# Patient Record
Sex: Male | Born: 1945 | Race: White | Hispanic: No | Marital: Married
Health system: Southern US, Community
[De-identification: ages and names within clinical notes are randomized; demographics above are authoritative.]

---

## 2000-07-25 ENCOUNTER — Encounter: Admission: RE | Admit: 2000-07-25 | Discharge: 2000-10-23 | Payer: Self-pay | Admitting: Internal Medicine

## 2000-11-23 ENCOUNTER — Ambulatory Visit (HOSPITAL_COMMUNITY): Admission: RE | Admit: 2000-11-23 | Discharge: 2000-11-23 | Payer: Self-pay | Admitting: Internal Medicine

## 2000-11-23 ENCOUNTER — Encounter: Payer: Self-pay | Admitting: Internal Medicine

## 2000-11-24 ENCOUNTER — Encounter: Payer: Self-pay | Admitting: Internal Medicine

## 2000-11-24 ENCOUNTER — Ambulatory Visit (HOSPITAL_COMMUNITY): Admission: RE | Admit: 2000-11-24 | Discharge: 2000-11-24 | Payer: Self-pay | Admitting: Internal Medicine

## 2001-05-15 ENCOUNTER — Encounter: Admission: RE | Admit: 2001-05-15 | Discharge: 2001-08-13 | Payer: Self-pay | Admitting: Internal Medicine

## 2002-04-18 ENCOUNTER — Other Ambulatory Visit: Admission: RE | Admit: 2002-04-18 | Discharge: 2002-04-18 | Payer: Self-pay | Admitting: Dermatology

## 2002-06-29 ENCOUNTER — Ambulatory Visit (HOSPITAL_COMMUNITY): Admission: RE | Admit: 2002-06-29 | Discharge: 2002-06-29 | Payer: Self-pay | Admitting: Internal Medicine

## 2002-07-05 ENCOUNTER — Encounter: Admission: RE | Admit: 2002-07-05 | Discharge: 2002-10-03 | Payer: Self-pay | Admitting: Internal Medicine

## 2003-02-04 ENCOUNTER — Encounter (HOSPITAL_COMMUNITY): Admission: RE | Admit: 2003-02-04 | Discharge: 2003-03-06 | Payer: Self-pay | Admitting: Internal Medicine

## 2003-02-05 ENCOUNTER — Encounter: Payer: Self-pay | Admitting: Internal Medicine

## 2004-03-12 ENCOUNTER — Encounter: Payer: Self-pay | Admitting: Emergency Medicine

## 2004-03-12 ENCOUNTER — Inpatient Hospital Stay (HOSPITAL_COMMUNITY): Admission: EM | Admit: 2004-03-12 | Discharge: 2004-03-15 | Payer: Self-pay | Admitting: Emergency Medicine

## 2004-03-20 ENCOUNTER — Encounter (HOSPITAL_COMMUNITY): Admission: RE | Admit: 2004-03-20 | Discharge: 2004-04-19 | Payer: Self-pay | Admitting: Neurology

## 2004-05-23 ENCOUNTER — Emergency Department (HOSPITAL_COMMUNITY): Admission: EM | Admit: 2004-05-23 | Discharge: 2004-05-23 | Payer: Self-pay | Admitting: Emergency Medicine

## 2004-05-25 ENCOUNTER — Ambulatory Visit: Payer: Self-pay | Admitting: Orthopedic Surgery

## 2004-06-15 ENCOUNTER — Ambulatory Visit: Payer: Self-pay | Admitting: Orthopedic Surgery

## 2005-10-04 ENCOUNTER — Ambulatory Visit: Payer: Self-pay | Admitting: Orthopedic Surgery

## 2011-04-07 DIAGNOSIS — L509 Urticaria, unspecified: Secondary | ICD-10-CM | POA: Insufficient documentation

## 2011-04-07 DIAGNOSIS — I872 Venous insufficiency (chronic) (peripheral): Secondary | ICD-10-CM | POA: Insufficient documentation

## 2011-04-07 DIAGNOSIS — L821 Other seborrheic keratosis: Secondary | ICD-10-CM | POA: Insufficient documentation

## 2011-10-28 ENCOUNTER — Other Ambulatory Visit (HOSPITAL_COMMUNITY): Payer: Self-pay | Admitting: Internal Medicine

## 2011-10-28 ENCOUNTER — Ambulatory Visit (HOSPITAL_COMMUNITY)
Admission: RE | Admit: 2011-10-28 | Discharge: 2011-10-28 | Disposition: A | Discharge status: Home / Self Care | Payer: BC Managed Care – PPO | Source: Ambulatory Visit | Attending: Internal Medicine | Admitting: Internal Medicine

## 2011-10-28 DIAGNOSIS — M25519 Pain in unspecified shoulder: Secondary | ICD-10-CM

## 2011-10-29 ENCOUNTER — Other Ambulatory Visit (HOSPITAL_COMMUNITY): Payer: Self-pay | Admitting: Internal Medicine

## 2011-10-29 DIAGNOSIS — M5412 Radiculopathy, cervical region: Secondary | ICD-10-CM

## 2011-11-01 ENCOUNTER — Ambulatory Visit (HOSPITAL_COMMUNITY)
Admission: RE | Admit: 2011-11-01 | Discharge: 2011-11-01 | Disposition: A | Discharge status: Home / Self Care | Payer: BC Managed Care – PPO | Source: Ambulatory Visit | Attending: Internal Medicine | Admitting: Internal Medicine

## 2011-11-01 DIAGNOSIS — M5020 Other cervical disc displacement, unspecified cervical region: Secondary | ICD-10-CM | POA: Insufficient documentation

## 2011-11-01 DIAGNOSIS — M5030 Other cervical disc degeneration, unspecified cervical region: Secondary | ICD-10-CM | POA: Insufficient documentation

## 2011-11-01 DIAGNOSIS — M542 Cervicalgia: Secondary | ICD-10-CM | POA: Insufficient documentation

## 2011-11-01 DIAGNOSIS — R209 Unspecified disturbances of skin sensation: Secondary | ICD-10-CM | POA: Insufficient documentation

## 2011-11-01 DIAGNOSIS — M5412 Radiculopathy, cervical region: Secondary | ICD-10-CM

## 2012-09-25 ENCOUNTER — Ambulatory Visit (HOSPITAL_COMMUNITY)
Admission: RE | Admit: 2012-09-25 | Discharge: 2012-09-25 | Disposition: A | Discharge status: Home / Self Care | Payer: Medicare Other | Source: Ambulatory Visit | Attending: Internal Medicine | Admitting: Internal Medicine

## 2012-09-25 ENCOUNTER — Other Ambulatory Visit (HOSPITAL_COMMUNITY): Payer: Self-pay | Admitting: Internal Medicine

## 2012-09-25 DIAGNOSIS — W19XXXA Unspecified fall, initial encounter: Secondary | ICD-10-CM

## 2012-09-25 DIAGNOSIS — M25569 Pain in unspecified knee: Secondary | ICD-10-CM | POA: Insufficient documentation

## 2013-01-22 ENCOUNTER — Ambulatory Visit (HOSPITAL_COMMUNITY)
Admission: RE | Admit: 2013-01-22 | Discharge: 2013-01-22 | Disposition: A | Discharge status: Home / Self Care | Payer: Medicare Other | Source: Ambulatory Visit | Attending: Internal Medicine | Admitting: Internal Medicine

## 2013-01-22 ENCOUNTER — Other Ambulatory Visit (HOSPITAL_COMMUNITY): Payer: Self-pay | Admitting: Internal Medicine

## 2013-01-22 DIAGNOSIS — K7689 Other specified diseases of liver: Secondary | ICD-10-CM | POA: Insufficient documentation

## 2013-01-22 DIAGNOSIS — R1011 Right upper quadrant pain: Secondary | ICD-10-CM

## 2013-04-11 DIAGNOSIS — D485 Neoplasm of uncertain behavior of skin: Secondary | ICD-10-CM | POA: Insufficient documentation

## 2015-04-16 DIAGNOSIS — Z85828 Personal history of other malignant neoplasm of skin: Secondary | ICD-10-CM | POA: Insufficient documentation

## 2015-06-30 DIAGNOSIS — E119 Type 2 diabetes mellitus without complications: Secondary | ICD-10-CM | POA: Diagnosis not present

## 2015-07-08 DIAGNOSIS — Z6831 Body mass index (BMI) 31.0-31.9, adult: Secondary | ICD-10-CM | POA: Diagnosis not present

## 2015-07-08 DIAGNOSIS — E119 Type 2 diabetes mellitus without complications: Secondary | ICD-10-CM | POA: Diagnosis not present

## 2015-07-08 DIAGNOSIS — I10 Essential (primary) hypertension: Secondary | ICD-10-CM | POA: Diagnosis not present

## 2015-07-16 DIAGNOSIS — M5412 Radiculopathy, cervical region: Secondary | ICD-10-CM | POA: Diagnosis not present

## 2015-07-23 ENCOUNTER — Ambulatory Visit: Payer: Self-pay | Admitting: Nutrition

## 2015-07-29 ENCOUNTER — Other Ambulatory Visit (HOSPITAL_COMMUNITY): Payer: Self-pay | Admitting: Internal Medicine

## 2015-07-29 DIAGNOSIS — M47812 Spondylosis without myelopathy or radiculopathy, cervical region: Secondary | ICD-10-CM

## 2015-08-01 ENCOUNTER — Ambulatory Visit (HOSPITAL_COMMUNITY)
Admission: RE | Admit: 2015-08-01 | Discharge: 2015-08-01 | Disposition: A | Discharge status: Home / Self Care | Payer: Medicare HMO | Source: Ambulatory Visit | Attending: Internal Medicine | Admitting: Internal Medicine

## 2015-08-01 DIAGNOSIS — M47812 Spondylosis without myelopathy or radiculopathy, cervical region: Secondary | ICD-10-CM | POA: Insufficient documentation

## 2015-08-01 DIAGNOSIS — M25512 Pain in left shoulder: Secondary | ICD-10-CM | POA: Diagnosis present

## 2015-08-01 DIAGNOSIS — M4692 Unspecified inflammatory spondylopathy, cervical region: Secondary | ICD-10-CM | POA: Diagnosis not present

## 2015-08-01 DIAGNOSIS — M5030 Other cervical disc degeneration, unspecified cervical region: Secondary | ICD-10-CM | POA: Diagnosis not present

## 2015-08-01 DIAGNOSIS — M542 Cervicalgia: Secondary | ICD-10-CM | POA: Diagnosis present

## 2015-08-01 DIAGNOSIS — M50222 Other cervical disc displacement at C5-C6 level: Secondary | ICD-10-CM | POA: Diagnosis not present

## 2015-08-01 DIAGNOSIS — M4802 Spinal stenosis, cervical region: Secondary | ICD-10-CM | POA: Insufficient documentation

## 2015-10-21 DIAGNOSIS — Z1211 Encounter for screening for malignant neoplasm of colon: Secondary | ICD-10-CM | POA: Diagnosis not present

## 2015-10-21 DIAGNOSIS — E669 Obesity, unspecified: Secondary | ICD-10-CM | POA: Diagnosis not present

## 2015-10-21 DIAGNOSIS — Z86010 Personal history of colonic polyps: Secondary | ICD-10-CM | POA: Diagnosis not present

## 2015-10-21 DIAGNOSIS — K5730 Diverticulosis of large intestine without perforation or abscess without bleeding: Secondary | ICD-10-CM | POA: Diagnosis not present

## 2015-10-31 DIAGNOSIS — I6350 Cerebral infarction due to unspecified occlusion or stenosis of unspecified cerebral artery: Secondary | ICD-10-CM | POA: Diagnosis not present

## 2015-10-31 DIAGNOSIS — E785 Hyperlipidemia, unspecified: Secondary | ICD-10-CM | POA: Diagnosis not present

## 2015-10-31 DIAGNOSIS — Z79899 Other long term (current) drug therapy: Secondary | ICD-10-CM | POA: Diagnosis not present

## 2015-10-31 DIAGNOSIS — E119 Type 2 diabetes mellitus without complications: Secondary | ICD-10-CM | POA: Diagnosis not present

## 2015-10-31 DIAGNOSIS — K219 Gastro-esophageal reflux disease without esophagitis: Secondary | ICD-10-CM | POA: Diagnosis not present

## 2015-10-31 DIAGNOSIS — Z125 Encounter for screening for malignant neoplasm of prostate: Secondary | ICD-10-CM | POA: Diagnosis not present

## 2015-10-31 DIAGNOSIS — I10 Essential (primary) hypertension: Secondary | ICD-10-CM | POA: Diagnosis not present

## 2015-11-07 DIAGNOSIS — E119 Type 2 diabetes mellitus without complications: Secondary | ICD-10-CM | POA: Diagnosis not present

## 2015-11-07 DIAGNOSIS — Z6832 Body mass index (BMI) 32.0-32.9, adult: Secondary | ICD-10-CM | POA: Diagnosis not present

## 2015-11-07 DIAGNOSIS — E785 Hyperlipidemia, unspecified: Secondary | ICD-10-CM | POA: Diagnosis not present

## 2015-11-07 DIAGNOSIS — K760 Fatty (change of) liver, not elsewhere classified: Secondary | ICD-10-CM | POA: Diagnosis not present

## 2015-12-05 DIAGNOSIS — Z1212 Encounter for screening for malignant neoplasm of rectum: Secondary | ICD-10-CM | POA: Diagnosis not present

## 2015-12-05 DIAGNOSIS — Z1211 Encounter for screening for malignant neoplasm of colon: Secondary | ICD-10-CM | POA: Diagnosis not present

## 2016-02-24 DIAGNOSIS — E119 Type 2 diabetes mellitus without complications: Secondary | ICD-10-CM | POA: Diagnosis not present

## 2016-03-05 DIAGNOSIS — Z79899 Other long term (current) drug therapy: Secondary | ICD-10-CM | POA: Diagnosis not present

## 2016-03-05 DIAGNOSIS — E119 Type 2 diabetes mellitus without complications: Secondary | ICD-10-CM | POA: Diagnosis not present

## 2016-03-12 DIAGNOSIS — E1149 Type 2 diabetes mellitus with other diabetic neurological complication: Secondary | ICD-10-CM | POA: Diagnosis not present

## 2016-03-12 DIAGNOSIS — Z23 Encounter for immunization: Secondary | ICD-10-CM | POA: Diagnosis not present

## 2016-03-12 DIAGNOSIS — K760 Fatty (change of) liver, not elsewhere classified: Secondary | ICD-10-CM | POA: Diagnosis not present

## 2016-03-12 DIAGNOSIS — I10 Essential (primary) hypertension: Secondary | ICD-10-CM | POA: Diagnosis not present

## 2016-04-19 DIAGNOSIS — Z85828 Personal history of other malignant neoplasm of skin: Secondary | ICD-10-CM | POA: Diagnosis not present

## 2016-04-19 DIAGNOSIS — L509 Urticaria, unspecified: Secondary | ICD-10-CM | POA: Diagnosis not present

## 2016-04-19 DIAGNOSIS — L820 Inflamed seborrheic keratosis: Secondary | ICD-10-CM | POA: Diagnosis not present

## 2016-04-19 DIAGNOSIS — L821 Other seborrheic keratosis: Secondary | ICD-10-CM | POA: Diagnosis not present

## 2016-07-09 DIAGNOSIS — Z79899 Other long term (current) drug therapy: Secondary | ICD-10-CM | POA: Diagnosis not present

## 2016-07-09 DIAGNOSIS — E119 Type 2 diabetes mellitus without complications: Secondary | ICD-10-CM | POA: Diagnosis not present

## 2016-07-09 DIAGNOSIS — K760 Fatty (change of) liver, not elsewhere classified: Secondary | ICD-10-CM | POA: Diagnosis not present

## 2016-07-16 DIAGNOSIS — K760 Fatty (change of) liver, not elsewhere classified: Secondary | ICD-10-CM | POA: Diagnosis not present

## 2016-07-16 DIAGNOSIS — E1149 Type 2 diabetes mellitus with other diabetic neurological complication: Secondary | ICD-10-CM | POA: Diagnosis not present

## 2016-07-16 DIAGNOSIS — Z6831 Body mass index (BMI) 31.0-31.9, adult: Secondary | ICD-10-CM | POA: Diagnosis not present

## 2016-07-16 DIAGNOSIS — K219 Gastro-esophageal reflux disease without esophagitis: Secondary | ICD-10-CM | POA: Diagnosis not present

## 2016-09-25 IMAGING — MR MR CERVICAL SPINE W/O CM
4 of 5 series · 14 of 48 positions shown · non-contrast
Comparison: 11/01/2011

CLINICAL DATA: Cervical spondylosis. Neck pain extending into the
left shoulder for 3-4 weeks without a known injury.

EXAM:
MRI CERVICAL SPINE WITHOUT CONTRAST
TECHNIQUE: Multiplanar, multisequence MR imaging of the cervical spine was
performed. No intravenous contrast was administered.

[Series 3: T2 · sagittal · 3.0mm · 0.40mm/px · 5 of 13 slices shown (1 of 2)]
[im 1/13]
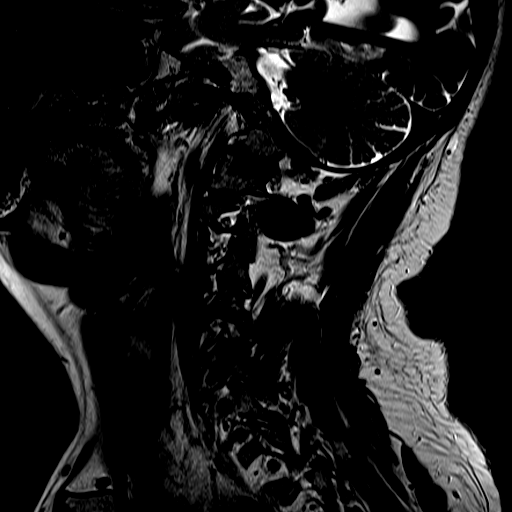
[im 4/13]
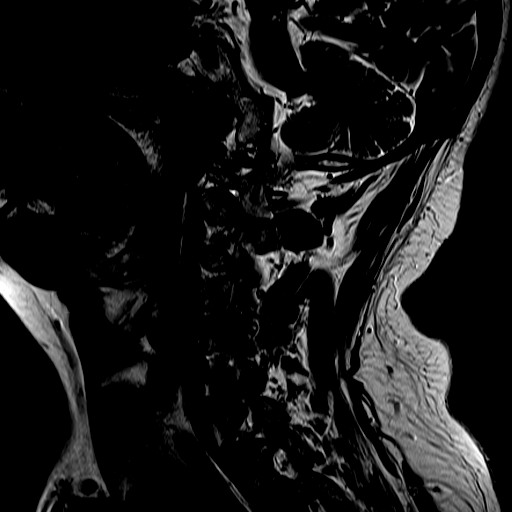
[im 7/13]
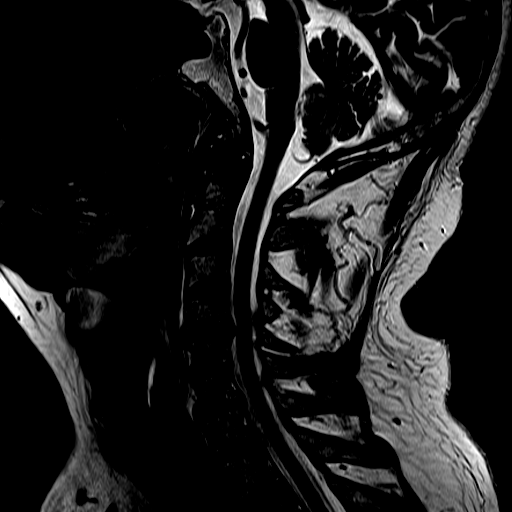
[im 10/13]
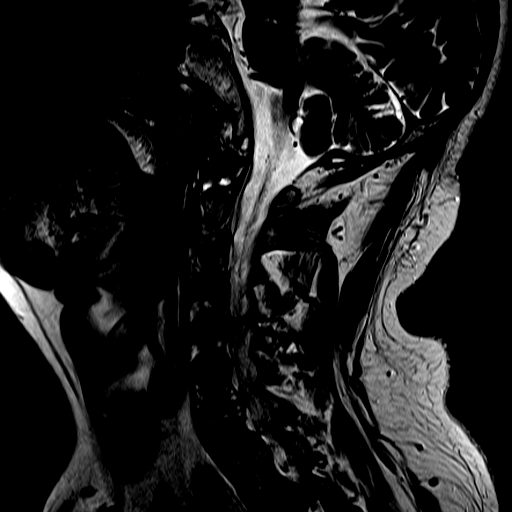
[im 13/13]
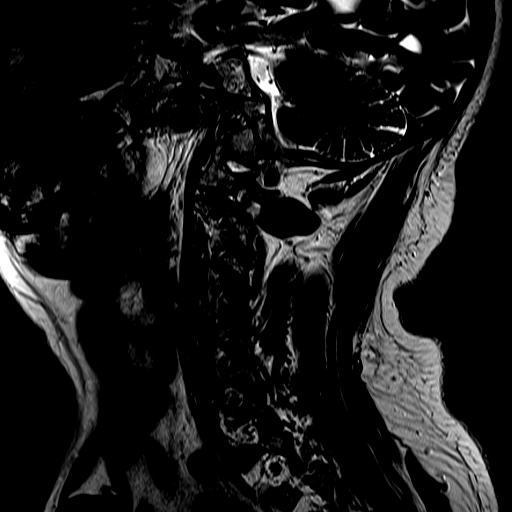

[Series 4: FLAIR · sagittal · 3.0mm · 0.44mm/px · 3 of 13 slices shown]
[im 1/13]
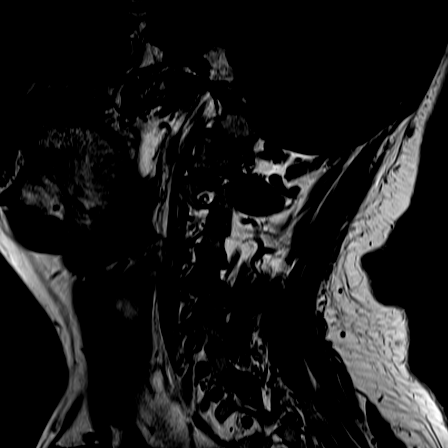
[im 7/13]
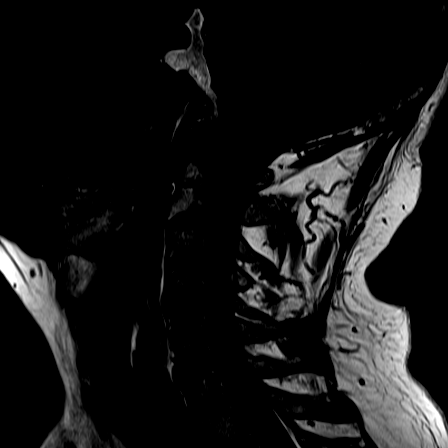
[im 13/13]
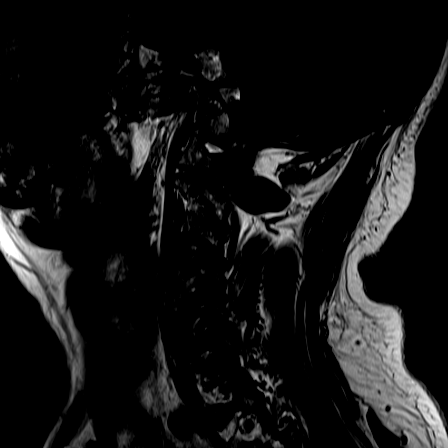

[Series 5: ir sagital · sagittal · 3.0mm · 0.24mm/px · 3 of 13 slices shown]
[im 3/13]
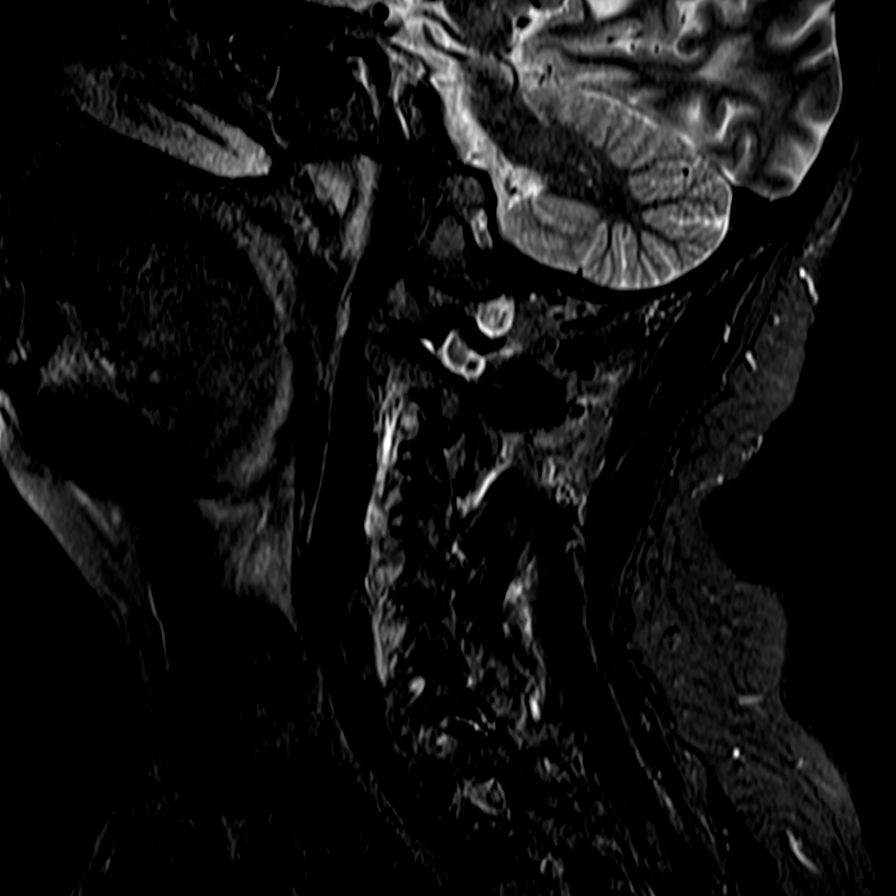
[im 8/13]
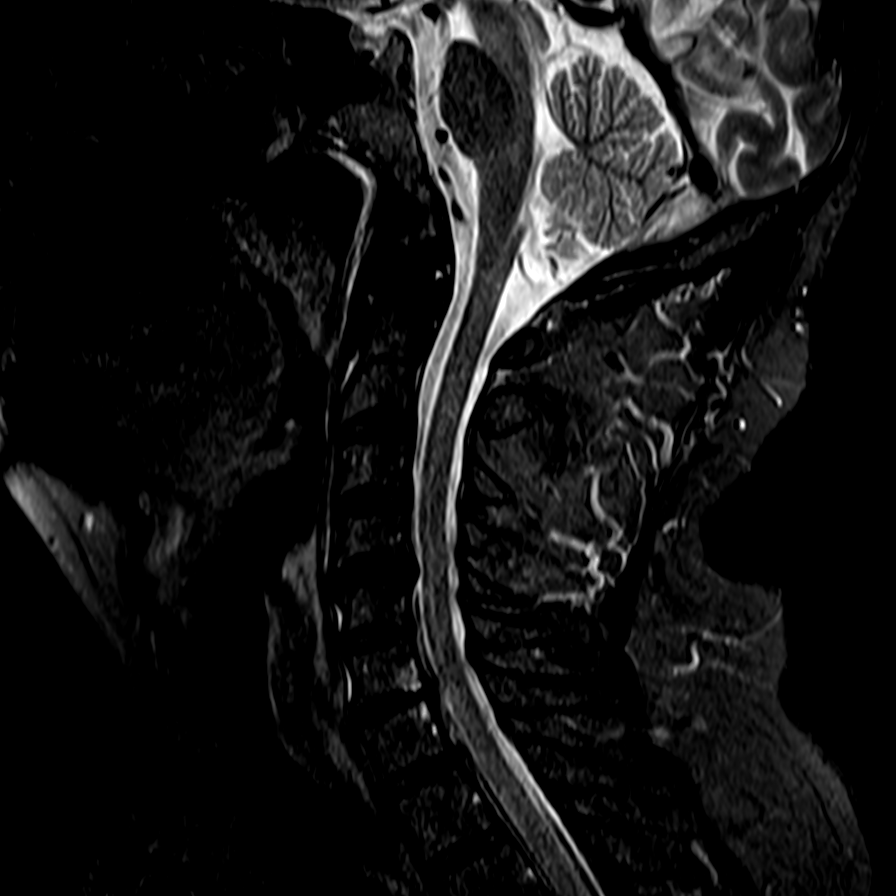
[im 13/13]
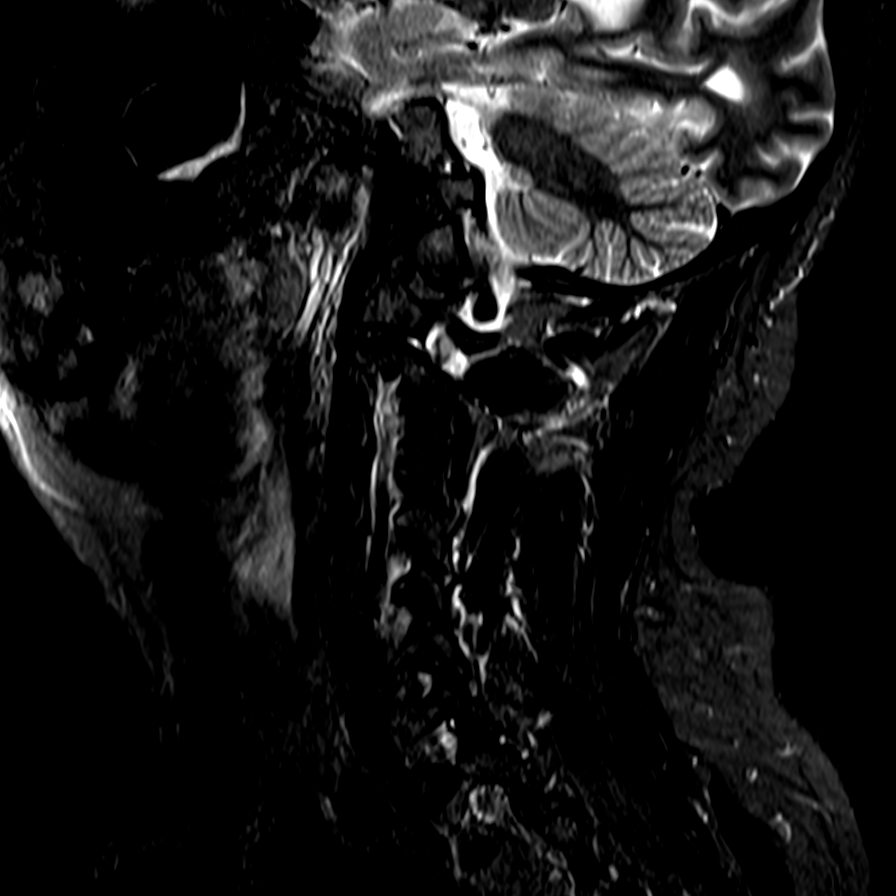

[Series 7: T2 · axial · 3.0mm · 0.22mm/px · z∈[-72,+13]mm · 3 of 36 slices shown (2 of 2)]
[im 5/36]
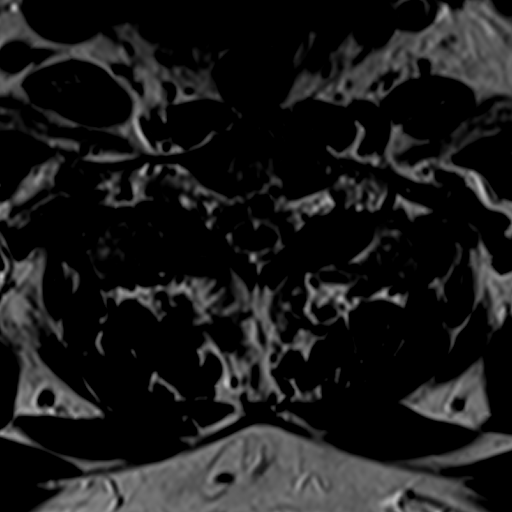
[im 19/36]
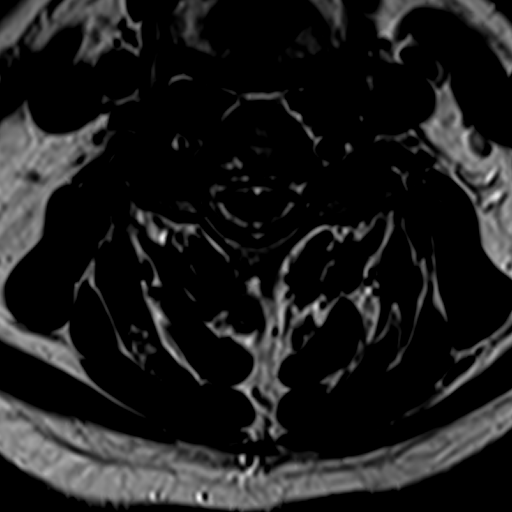
[im 31/36]
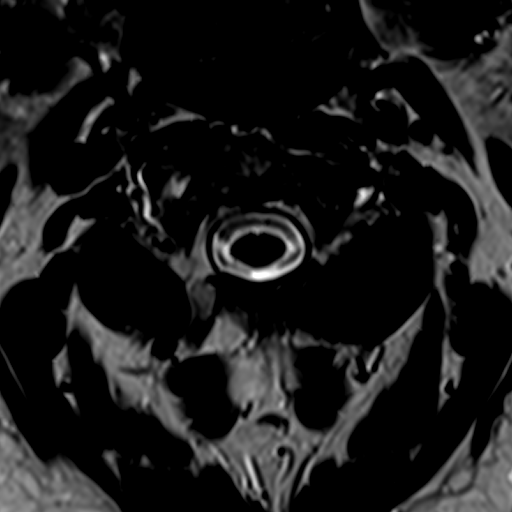

[14 of 48 positions shown; findings below may reference images not displayed]

FINDINGS: Vertebral alignment is normal. Vertebral body heights are preserved.
New, mild degenerative endplate edema is noted posteriorly at C6-7.
There is also new, mild edema associated with right-sided facet
arthritis at C4-5. Mild C1-2 degenerative change is noted. The
cervical spinal cord is normal in caliber and signal. Paraspinal
soft tissues are unremarkable.

C2-3: Mild to moderate left facet arthrosis and uncovertebral
spurring without significant stenosis.

C3-4: Mild left facet arthrosis without disc herniation or stenosis,
unchanged.

C4-5: Disc bulging, uncovertebral spurring, and right greater than
left facet arthrosis result in mild spinal stenosis and mild
bilateral neural foraminal stenosis, unchanged. There is at most a
minimal impression on the ventral spinal cord.

C5-6: Minimal uncovertebral and facet arthrosis without stenosis,
unchanged.

C6-7: Disc bulging and uncovertebral spurring result in moderate
bilateral neural foraminal stenosis without significant spinal
stenosis, unchanged.

C7-T1: Small left central disc extrusion without stenosis,
unchanged.
IMPRESSION: 1. New edema associated with right-sided facet arthritis at C4-5.
2. Multilevel disc degeneration without significant interval change.
Mild spinal stenosis at C4-5 and moderate bilateral foraminal
stenosis at C6-7.

## 2016-11-09 DIAGNOSIS — G464 Cerebellar stroke syndrome: Secondary | ICD-10-CM | POA: Diagnosis not present

## 2016-11-09 DIAGNOSIS — D3709 Neoplasm of uncertain behavior of other specified sites of the oral cavity: Secondary | ICD-10-CM | POA: Diagnosis not present

## 2016-11-09 DIAGNOSIS — E119 Type 2 diabetes mellitus without complications: Secondary | ICD-10-CM | POA: Diagnosis not present

## 2016-11-09 DIAGNOSIS — K219 Gastro-esophageal reflux disease without esophagitis: Secondary | ICD-10-CM | POA: Diagnosis not present

## 2016-11-09 DIAGNOSIS — Z125 Encounter for screening for malignant neoplasm of prostate: Secondary | ICD-10-CM | POA: Diagnosis not present

## 2016-11-09 DIAGNOSIS — I10 Essential (primary) hypertension: Secondary | ICD-10-CM | POA: Diagnosis not present

## 2016-11-09 DIAGNOSIS — Z79899 Other long term (current) drug therapy: Secondary | ICD-10-CM | POA: Diagnosis not present

## 2016-11-16 DIAGNOSIS — E785 Hyperlipidemia, unspecified: Secondary | ICD-10-CM | POA: Diagnosis not present

## 2016-11-16 DIAGNOSIS — K760 Fatty (change of) liver, not elsewhere classified: Secondary | ICD-10-CM | POA: Diagnosis not present

## 2016-11-16 DIAGNOSIS — E1149 Type 2 diabetes mellitus with other diabetic neurological complication: Secondary | ICD-10-CM | POA: Diagnosis not present

## 2016-11-17 DIAGNOSIS — D1030 Benign neoplasm of unspecified part of mouth: Secondary | ICD-10-CM | POA: Diagnosis not present

## 2016-11-18 DIAGNOSIS — D1809 Hemangioma of other sites: Secondary | ICD-10-CM | POA: Diagnosis not present

## 2016-11-23 DIAGNOSIS — Z86010 Personal history of colonic polyps: Secondary | ICD-10-CM | POA: Diagnosis not present

## 2016-11-23 DIAGNOSIS — K5730 Diverticulosis of large intestine without perforation or abscess without bleeding: Secondary | ICD-10-CM | POA: Diagnosis not present

## 2016-11-23 DIAGNOSIS — Z1211 Encounter for screening for malignant neoplasm of colon: Secondary | ICD-10-CM | POA: Diagnosis not present

## 2017-01-03 DIAGNOSIS — D123 Benign neoplasm of transverse colon: Secondary | ICD-10-CM | POA: Diagnosis not present

## 2017-01-03 DIAGNOSIS — D125 Benign neoplasm of sigmoid colon: Secondary | ICD-10-CM | POA: Diagnosis not present

## 2017-01-03 DIAGNOSIS — D122 Benign neoplasm of ascending colon: Secondary | ICD-10-CM | POA: Diagnosis not present

## 2017-01-03 DIAGNOSIS — Z1211 Encounter for screening for malignant neoplasm of colon: Secondary | ICD-10-CM | POA: Diagnosis not present

## 2017-01-03 DIAGNOSIS — K635 Polyp of colon: Secondary | ICD-10-CM | POA: Diagnosis not present

## 2017-01-03 DIAGNOSIS — D120 Benign neoplasm of cecum: Secondary | ICD-10-CM | POA: Diagnosis not present

## 2017-02-10 DIAGNOSIS — E119 Type 2 diabetes mellitus without complications: Secondary | ICD-10-CM | POA: Diagnosis not present

## 2017-02-10 DIAGNOSIS — H35362 Drusen (degenerative) of macula, left eye: Secondary | ICD-10-CM | POA: Diagnosis not present

## 2017-03-03 DIAGNOSIS — R69 Illness, unspecified: Secondary | ICD-10-CM | POA: Diagnosis not present

## 2017-03-21 DIAGNOSIS — R69 Illness, unspecified: Secondary | ICD-10-CM | POA: Diagnosis not present

## 2017-03-21 DIAGNOSIS — E119 Type 2 diabetes mellitus without complications: Secondary | ICD-10-CM | POA: Diagnosis not present

## 2017-03-28 DIAGNOSIS — E1149 Type 2 diabetes mellitus with other diabetic neurological complication: Secondary | ICD-10-CM | POA: Diagnosis not present

## 2017-03-28 DIAGNOSIS — I10 Essential (primary) hypertension: Secondary | ICD-10-CM | POA: Diagnosis not present

## 2017-04-20 DIAGNOSIS — Z85828 Personal history of other malignant neoplasm of skin: Secondary | ICD-10-CM | POA: Diagnosis not present

## 2017-04-20 DIAGNOSIS — L821 Other seborrheic keratosis: Secondary | ICD-10-CM | POA: Diagnosis not present

## 2017-04-20 DIAGNOSIS — L509 Urticaria, unspecified: Secondary | ICD-10-CM | POA: Diagnosis not present

## 2017-04-20 DIAGNOSIS — L570 Actinic keratosis: Secondary | ICD-10-CM | POA: Diagnosis not present

## 2017-04-20 DIAGNOSIS — I872 Venous insufficiency (chronic) (peripheral): Secondary | ICD-10-CM | POA: Diagnosis not present

## 2017-07-25 DIAGNOSIS — E1142 Type 2 diabetes mellitus with diabetic polyneuropathy: Secondary | ICD-10-CM | POA: Diagnosis not present

## 2017-07-25 DIAGNOSIS — I10 Essential (primary) hypertension: Secondary | ICD-10-CM | POA: Diagnosis not present

## 2017-08-01 DIAGNOSIS — I10 Essential (primary) hypertension: Secondary | ICD-10-CM | POA: Diagnosis not present

## 2017-08-01 DIAGNOSIS — Z6832 Body mass index (BMI) 32.0-32.9, adult: Secondary | ICD-10-CM | POA: Diagnosis not present

## 2017-08-01 DIAGNOSIS — E1149 Type 2 diabetes mellitus with other diabetic neurological complication: Secondary | ICD-10-CM | POA: Diagnosis not present

## 2017-11-14 DIAGNOSIS — R69 Illness, unspecified: Secondary | ICD-10-CM | POA: Diagnosis not present

## 2017-11-14 DIAGNOSIS — E1140 Type 2 diabetes mellitus with diabetic neuropathy, unspecified: Secondary | ICD-10-CM | POA: Diagnosis not present

## 2017-11-14 DIAGNOSIS — Z79899 Other long term (current) drug therapy: Secondary | ICD-10-CM | POA: Diagnosis not present

## 2017-11-14 DIAGNOSIS — Z125 Encounter for screening for malignant neoplasm of prostate: Secondary | ICD-10-CM | POA: Diagnosis not present

## 2017-11-14 DIAGNOSIS — I10 Essential (primary) hypertension: Secondary | ICD-10-CM | POA: Diagnosis not present

## 2017-11-21 DIAGNOSIS — K760 Fatty (change of) liver, not elsewhere classified: Secondary | ICD-10-CM | POA: Diagnosis not present

## 2017-11-21 DIAGNOSIS — D696 Thrombocytopenia, unspecified: Secondary | ICD-10-CM | POA: Diagnosis not present

## 2017-11-21 DIAGNOSIS — E785 Hyperlipidemia, unspecified: Secondary | ICD-10-CM | POA: Diagnosis not present

## 2017-11-21 DIAGNOSIS — E1149 Type 2 diabetes mellitus with other diabetic neurological complication: Secondary | ICD-10-CM | POA: Diagnosis not present

## 2018-01-17 DIAGNOSIS — H35362 Drusen (degenerative) of macula, left eye: Secondary | ICD-10-CM | POA: Diagnosis not present

## 2018-01-17 DIAGNOSIS — E119 Type 2 diabetes mellitus without complications: Secondary | ICD-10-CM | POA: Diagnosis not present

## 2018-03-28 DIAGNOSIS — Z23 Encounter for immunization: Secondary | ICD-10-CM | POA: Diagnosis not present

## 2018-03-29 DIAGNOSIS — X32XXXA Exposure to sunlight, initial encounter: Secondary | ICD-10-CM | POA: Diagnosis not present

## 2018-03-29 DIAGNOSIS — L570 Actinic keratosis: Secondary | ICD-10-CM | POA: Diagnosis not present

## 2018-03-29 DIAGNOSIS — B351 Tinea unguium: Secondary | ICD-10-CM | POA: Diagnosis not present

## 2018-03-29 DIAGNOSIS — Z1283 Encounter for screening for malignant neoplasm of skin: Secondary | ICD-10-CM | POA: Diagnosis not present

## 2018-03-29 DIAGNOSIS — D225 Melanocytic nevi of trunk: Secondary | ICD-10-CM | POA: Diagnosis not present

## 2018-03-29 DIAGNOSIS — I872 Venous insufficiency (chronic) (peripheral): Secondary | ICD-10-CM | POA: Diagnosis not present

## 2018-03-29 DIAGNOSIS — L820 Inflamed seborrheic keratosis: Secondary | ICD-10-CM | POA: Diagnosis not present

## 2018-04-11 DIAGNOSIS — I10 Essential (primary) hypertension: Secondary | ICD-10-CM | POA: Diagnosis not present

## 2018-04-11 DIAGNOSIS — E1140 Type 2 diabetes mellitus with diabetic neuropathy, unspecified: Secondary | ICD-10-CM | POA: Diagnosis not present

## 2018-04-11 DIAGNOSIS — E785 Hyperlipidemia, unspecified: Secondary | ICD-10-CM | POA: Diagnosis not present

## 2018-05-08 DIAGNOSIS — E1159 Type 2 diabetes mellitus with other circulatory complications: Secondary | ICD-10-CM | POA: Diagnosis not present

## 2018-05-08 DIAGNOSIS — I10 Essential (primary) hypertension: Secondary | ICD-10-CM | POA: Diagnosis not present

## 2018-06-15 DIAGNOSIS — R69 Illness, unspecified: Secondary | ICD-10-CM | POA: Diagnosis not present

## 2018-07-19 DIAGNOSIS — R69 Illness, unspecified: Secondary | ICD-10-CM | POA: Diagnosis not present

## 2018-08-15 DIAGNOSIS — E1159 Type 2 diabetes mellitus with other circulatory complications: Secondary | ICD-10-CM | POA: Diagnosis not present

## 2018-08-15 DIAGNOSIS — G464 Cerebellar stroke syndrome: Secondary | ICD-10-CM | POA: Diagnosis not present

## 2018-08-15 DIAGNOSIS — E785 Hyperlipidemia, unspecified: Secondary | ICD-10-CM | POA: Diagnosis not present

## 2018-08-22 DIAGNOSIS — E785 Hyperlipidemia, unspecified: Secondary | ICD-10-CM | POA: Diagnosis not present

## 2018-08-22 DIAGNOSIS — Z6831 Body mass index (BMI) 31.0-31.9, adult: Secondary | ICD-10-CM | POA: Diagnosis not present

## 2018-08-22 DIAGNOSIS — E1159 Type 2 diabetes mellitus with other circulatory complications: Secondary | ICD-10-CM | POA: Diagnosis not present

## 2018-10-11 DIAGNOSIS — R69 Illness, unspecified: Secondary | ICD-10-CM | POA: Diagnosis not present

## 2018-11-09 DIAGNOSIS — R69 Illness, unspecified: Secondary | ICD-10-CM | POA: Diagnosis not present

## 2018-11-23 DIAGNOSIS — E1159 Type 2 diabetes mellitus with other circulatory complications: Secondary | ICD-10-CM | POA: Diagnosis not present

## 2018-11-23 DIAGNOSIS — E785 Hyperlipidemia, unspecified: Secondary | ICD-10-CM | POA: Diagnosis not present

## 2018-12-05 DIAGNOSIS — R69 Illness, unspecified: Secondary | ICD-10-CM | POA: Diagnosis not present

## 2018-12-18 DIAGNOSIS — E1159 Type 2 diabetes mellitus with other circulatory complications: Secondary | ICD-10-CM | POA: Diagnosis not present

## 2018-12-18 DIAGNOSIS — Z79899 Other long term (current) drug therapy: Secondary | ICD-10-CM | POA: Diagnosis not present

## 2018-12-18 DIAGNOSIS — E785 Hyperlipidemia, unspecified: Secondary | ICD-10-CM | POA: Diagnosis not present

## 2018-12-18 DIAGNOSIS — K219 Gastro-esophageal reflux disease without esophagitis: Secondary | ICD-10-CM | POA: Diagnosis not present

## 2018-12-18 DIAGNOSIS — I10 Essential (primary) hypertension: Secondary | ICD-10-CM | POA: Diagnosis not present

## 2018-12-18 DIAGNOSIS — K760 Fatty (change of) liver, not elsewhere classified: Secondary | ICD-10-CM | POA: Diagnosis not present

## 2018-12-18 DIAGNOSIS — I6350 Cerebral infarction due to unspecified occlusion or stenosis of unspecified cerebral artery: Secondary | ICD-10-CM | POA: Diagnosis not present

## 2019-01-11 DIAGNOSIS — D696 Thrombocytopenia, unspecified: Secondary | ICD-10-CM | POA: Diagnosis not present

## 2019-01-11 DIAGNOSIS — E785 Hyperlipidemia, unspecified: Secondary | ICD-10-CM | POA: Diagnosis not present

## 2019-01-11 DIAGNOSIS — K760 Fatty (change of) liver, not elsewhere classified: Secondary | ICD-10-CM | POA: Diagnosis not present

## 2019-01-11 DIAGNOSIS — E1159 Type 2 diabetes mellitus with other circulatory complications: Secondary | ICD-10-CM | POA: Diagnosis not present

## 2019-01-22 DIAGNOSIS — E119 Type 2 diabetes mellitus without complications: Secondary | ICD-10-CM | POA: Diagnosis not present

## 2019-01-22 DIAGNOSIS — H35362 Drusen (degenerative) of macula, left eye: Secondary | ICD-10-CM | POA: Diagnosis not present

## 2019-01-24 DIAGNOSIS — R69 Illness, unspecified: Secondary | ICD-10-CM | POA: Diagnosis not present

## 2019-02-23 DIAGNOSIS — R69 Illness, unspecified: Secondary | ICD-10-CM | POA: Diagnosis not present

## 2019-02-26 DIAGNOSIS — R69 Illness, unspecified: Secondary | ICD-10-CM | POA: Diagnosis not present

## 2019-04-05 DIAGNOSIS — R69 Illness, unspecified: Secondary | ICD-10-CM | POA: Diagnosis not present

## 2019-05-01 DIAGNOSIS — E1159 Type 2 diabetes mellitus with other circulatory complications: Secondary | ICD-10-CM | POA: Diagnosis not present

## 2019-05-08 DIAGNOSIS — E1159 Type 2 diabetes mellitus with other circulatory complications: Secondary | ICD-10-CM | POA: Diagnosis not present

## 2019-05-08 DIAGNOSIS — I10 Essential (primary) hypertension: Secondary | ICD-10-CM | POA: Diagnosis not present

## 2019-05-24 DIAGNOSIS — R69 Illness, unspecified: Secondary | ICD-10-CM | POA: Diagnosis not present

## 2019-06-28 DIAGNOSIS — R69 Illness, unspecified: Secondary | ICD-10-CM | POA: Diagnosis not present

## 2020-04-10 ENCOUNTER — Ambulatory Visit: Payer: Medicare HMO | Attending: Internal Medicine

## 2020-04-10 DIAGNOSIS — Z23 Encounter for immunization: Secondary | ICD-10-CM

## 2020-04-10 NOTE — Progress Notes (Signed)
   [facility]  Name:  Eric Burke    MRN: [id number] DOB: 1945/10/28  04/10/2020  Eric Burke was observed post Covid-19 immunization for 15 minutes without incident. He was provided with Vaccine Information Sheet and instruction to access the V-Safe system.   Eric Burke was instructed to call 911 with any severe reactions post vaccine: Marland Kitchen Difficulty breathing  . Swelling of face and throat  . A fast heartbeat  . A bad rash all over body  . Dizziness and weakness

## 2020-12-30 ENCOUNTER — Other Ambulatory Visit: Payer: Self-pay

## 2020-12-30 ENCOUNTER — Encounter: Payer: Self-pay | Admitting: Podiatry

## 2020-12-30 ENCOUNTER — Ambulatory Visit: Payer: Medicare HMO | Admitting: Podiatry

## 2020-12-30 DIAGNOSIS — M67472 Ganglion, left ankle and foot: Secondary | ICD-10-CM | POA: Diagnosis not present

## 2020-12-30 DIAGNOSIS — E119 Type 2 diabetes mellitus without complications: Secondary | ICD-10-CM | POA: Insufficient documentation

## 2020-12-30 DIAGNOSIS — M7752 Other enthesopathy of left foot: Secondary | ICD-10-CM | POA: Diagnosis not present

## 2020-12-30 DIAGNOSIS — F4312 Post-traumatic stress disorder, chronic: Secondary | ICD-10-CM | POA: Insufficient documentation

## 2020-12-30 DIAGNOSIS — I10 Essential (primary) hypertension: Secondary | ICD-10-CM | POA: Insufficient documentation

## 2020-12-30 DIAGNOSIS — H9072 Mixed conductive and sensorineural hearing loss, unilateral, left ear, with unrestricted hearing on the contralateral side: Secondary | ICD-10-CM | POA: Insufficient documentation

## 2021-01-02 ENCOUNTER — Encounter: Payer: Self-pay | Admitting: Podiatry

## 2021-01-02 NOTE — Progress Notes (Signed)
  Subjective:  Patient ID: Eric Burke, male    DOB: 01/29/1946,  MRN: [id number]  Chief Complaint  Patient presents with   Callouses     np - knot on side of the left foot    75 y.o. male presents with the above complaint. History confirmed with patient.  Sometimes can be painful with pressure when he gets really big seems to wax and wane in size and is not painful now currently  Objective:  Physical Exam: warm, good capillary refill, no trophic changes or ulcerative lesions, normal DP and PT pulses, and normal sensory exam. Left Foot: Fourth tarsometatarsal joint there is a palpable bony spur with ganglion cyst overlying this Assessment:   1. Ganglion cyst of left foot   2. Bone spur of left foot      Plan:  Patient was evaluated and treated and all questions answered.  Reviewed etiology and treatment options for both the bony spur and ganglion cyst including drainage and surgical excision.  Discussed with him likely would need excision if it became symptomatic problematic for him due to the underlying spur.  If this worsens or returns he will return for follow-up and we will take x-rays and possibly MRI.  Return if symptoms worsen or fail to improve.
